# Patient Record
Sex: Female | Born: 1975 | Race: Black or African American | Hispanic: No | Marital: Married | Smoking: Current every day smoker
Health system: Southern US, Community
[De-identification: ages and names within clinical notes are randomized; demographics above are authoritative.]

---

## 1997-05-21 ENCOUNTER — Inpatient Hospital Stay (HOSPITAL_COMMUNITY): Admission: AD | Admit: 1997-05-21 | Discharge: 1997-05-21 | Payer: Self-pay | Admitting: Obstetrics & Gynecology

## 1997-08-13 ENCOUNTER — Inpatient Hospital Stay (HOSPITAL_COMMUNITY): Admission: AD | Admit: 1997-08-13 | Discharge: 1997-08-14 | Payer: Self-pay | Admitting: Obstetrics

## 1998-06-28 ENCOUNTER — Emergency Department (HOSPITAL_COMMUNITY): Admission: EM | Admit: 1998-06-28 | Discharge: 1998-06-28 | Payer: Self-pay | Admitting: Emergency Medicine

## 1998-08-19 ENCOUNTER — Other Ambulatory Visit: Admission: RE | Admit: 1998-08-19 | Discharge: 1998-08-19 | Payer: Self-pay | Admitting: Obstetrics and Gynecology

## 1998-11-02 ENCOUNTER — Inpatient Hospital Stay (HOSPITAL_COMMUNITY): Admission: AD | Admit: 1998-11-02 | Discharge: 1998-11-02 | Payer: Self-pay | Admitting: *Deleted

## 1998-12-15 ENCOUNTER — Inpatient Hospital Stay (HOSPITAL_COMMUNITY): Admission: AD | Admit: 1998-12-15 | Discharge: 1998-12-15 | Payer: Self-pay | Admitting: Obstetrics and Gynecology

## 1999-02-06 ENCOUNTER — Inpatient Hospital Stay (HOSPITAL_COMMUNITY): Admission: AD | Admit: 1999-02-06 | Discharge: 1999-02-08 | Payer: Self-pay | Admitting: *Deleted

## 1999-08-28 ENCOUNTER — Other Ambulatory Visit: Admission: RE | Admit: 1999-08-28 | Discharge: 1999-08-28 | Payer: Self-pay | Admitting: Obstetrics & Gynecology

## 2000-06-14 ENCOUNTER — Emergency Department (HOSPITAL_COMMUNITY): Admission: EM | Admit: 2000-06-14 | Discharge: 2000-06-14 | Payer: Self-pay | Admitting: Emergency Medicine

## 2001-08-21 ENCOUNTER — Emergency Department (HOSPITAL_COMMUNITY): Admission: EM | Admit: 2001-08-21 | Discharge: 2001-08-21 | Payer: Self-pay | Admitting: Emergency Medicine

## 2002-09-25 ENCOUNTER — Emergency Department (HOSPITAL_COMMUNITY): Admission: EM | Admit: 2002-09-25 | Discharge: 2002-09-25 | Payer: Self-pay | Admitting: Emergency Medicine

## 2002-10-15 ENCOUNTER — Emergency Department (HOSPITAL_COMMUNITY): Admission: EM | Admit: 2002-10-15 | Discharge: 2002-10-15 | Payer: Self-pay | Admitting: Emergency Medicine

## 2003-10-21 ENCOUNTER — Emergency Department (HOSPITAL_COMMUNITY): Admission: EM | Admit: 2003-10-21 | Discharge: 2003-10-21 | Payer: Self-pay | Admitting: *Deleted

## 2004-05-11 ENCOUNTER — Emergency Department (HOSPITAL_COMMUNITY): Admission: EM | Admit: 2004-05-11 | Discharge: 2004-05-11 | Payer: Self-pay | Admitting: Emergency Medicine

## 2004-06-02 ENCOUNTER — Emergency Department (HOSPITAL_COMMUNITY): Admission: EM | Admit: 2004-06-02 | Discharge: 2004-06-02 | Payer: Self-pay | Admitting: Emergency Medicine

## 2004-08-04 ENCOUNTER — Emergency Department (HOSPITAL_COMMUNITY): Admission: EM | Admit: 2004-08-04 | Discharge: 2004-08-05 | Payer: Self-pay | Admitting: Emergency Medicine

## 2004-08-18 ENCOUNTER — Inpatient Hospital Stay (HOSPITAL_COMMUNITY): Admission: AD | Admit: 2004-08-18 | Discharge: 2004-08-18 | Payer: Self-pay | Admitting: *Deleted

## 2004-08-25 ENCOUNTER — Inpatient Hospital Stay (HOSPITAL_COMMUNITY): Admission: AD | Admit: 2004-08-25 | Discharge: 2004-08-25 | Payer: Self-pay | Admitting: Obstetrics and Gynecology

## 2004-10-14 ENCOUNTER — Other Ambulatory Visit: Admission: RE | Admit: 2004-10-14 | Discharge: 2004-10-14 | Payer: Self-pay | Admitting: Obstetrics and Gynecology

## 2005-02-28 ENCOUNTER — Observation Stay (HOSPITAL_COMMUNITY): Admission: AD | Admit: 2005-02-28 | Discharge: 2005-02-28 | Payer: Self-pay | Admitting: Obstetrics and Gynecology

## 2005-03-01 ENCOUNTER — Inpatient Hospital Stay (HOSPITAL_COMMUNITY): Admission: AD | Admit: 2005-03-01 | Discharge: 2005-03-02 | Payer: Self-pay | Admitting: Obstetrics and Gynecology

## 2005-03-02 ENCOUNTER — Inpatient Hospital Stay (HOSPITAL_COMMUNITY): Admission: AD | Admit: 2005-03-02 | Discharge: 2005-03-04 | Payer: Self-pay | Admitting: Obstetrics and Gynecology

## 2005-09-18 IMAGING — US US OB COMP LESS 14 WK
1 series · 14 of 17 positions shown · non-contrast
Comparison: none

CLINICAL DATA: Abdominal pain with nausea and vomiting.
EARLY OBSTETRICAL ULTRASOUND:
Multiple images of the uterus and adnexa were obtained using a transabdominal approach.
There is a single intrauterine pregnancy identified that demonstrates an estimated gestational age by ultrasound of 10 weeks and 6 days.  Positive regular fetal cardiac activity with a rate of 175 bpm was noted.  A normal appearing yolk sac and amnion are seen.  An small old subchorionic hemorrhage is noted.
Both ovaries are seen with the right ovary measuring 2.1 x 3.0 x 2.1 cm and containing a corpus luteum cyst.  The left ovary measures 3.2 x 1.1 x 1.3 cm and has a normal ultrasound appearance.  No cul-de-sac or periovarian fluid is seen and no separate adnexal masses are noted.

[Series 1: us ob comp less 14 wk · 0.29mm/px · 14 of 17 slices shown]
[im 1/17]
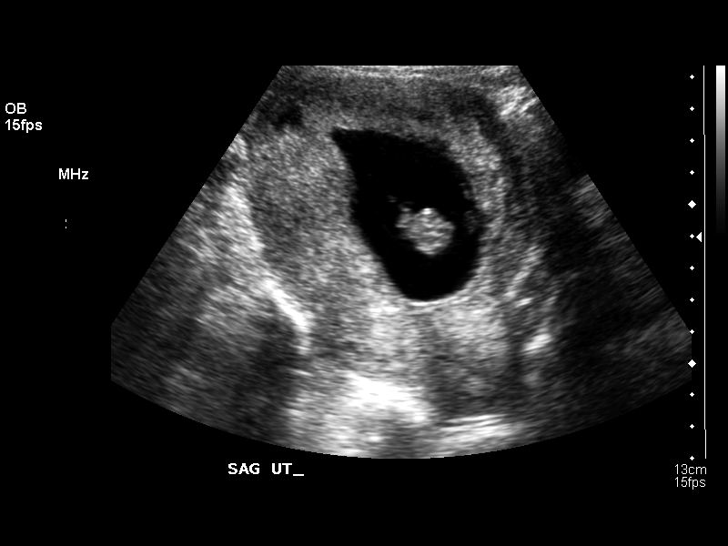
[im 2/17]
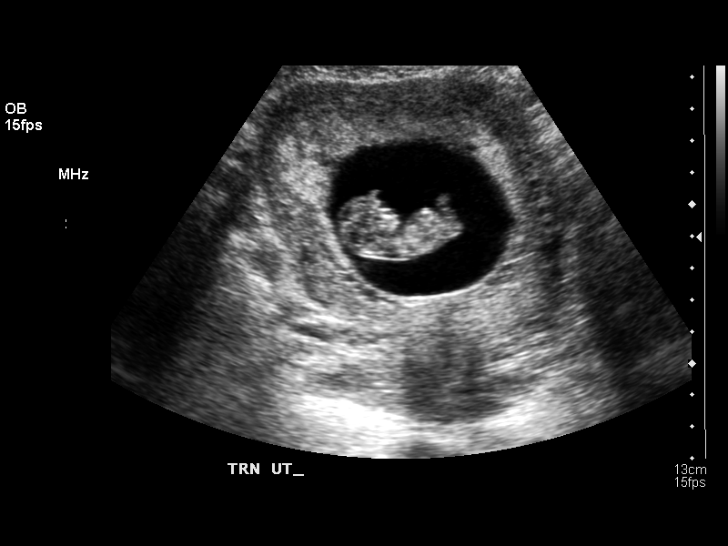
[im 4/17]
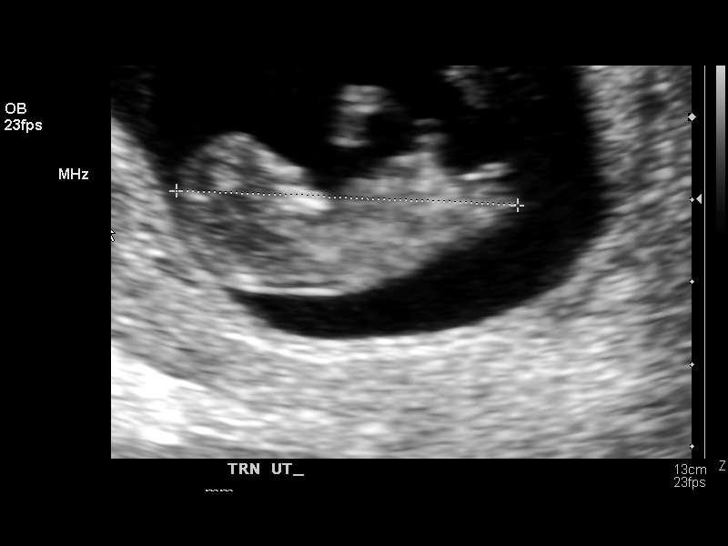
[im 5/17]
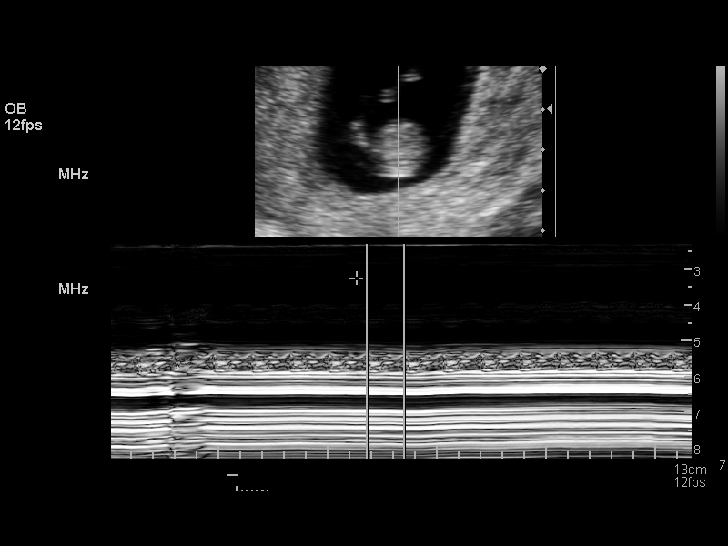
[im 6/17]
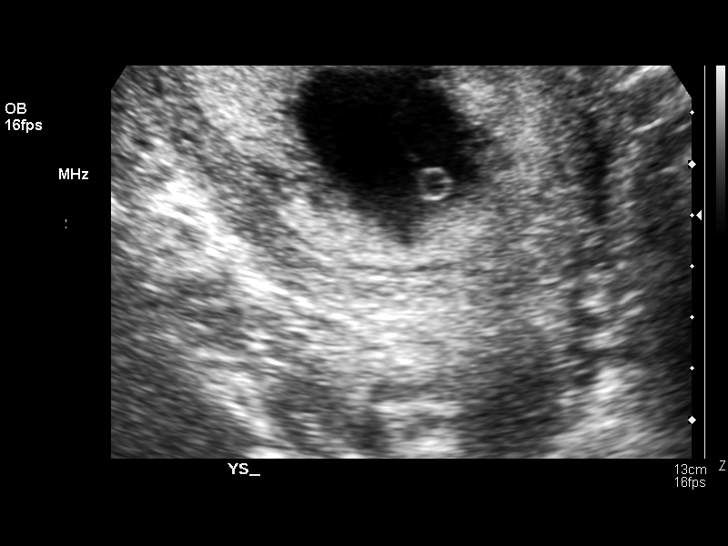
[im 7/17]
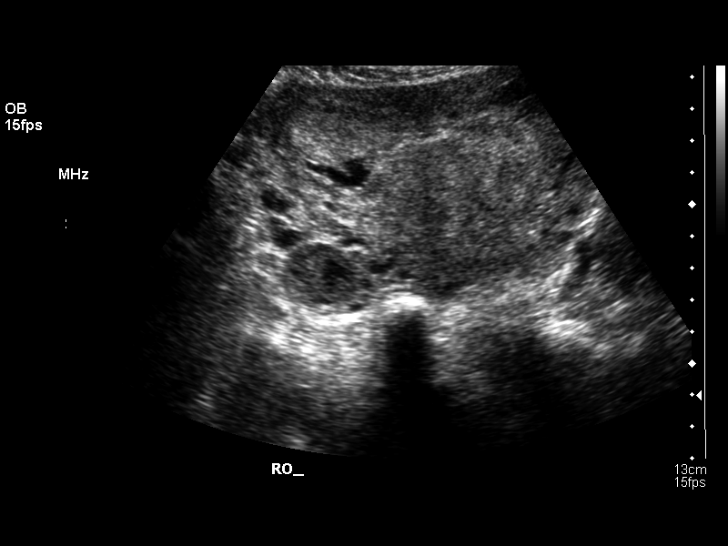
[im 8/17]
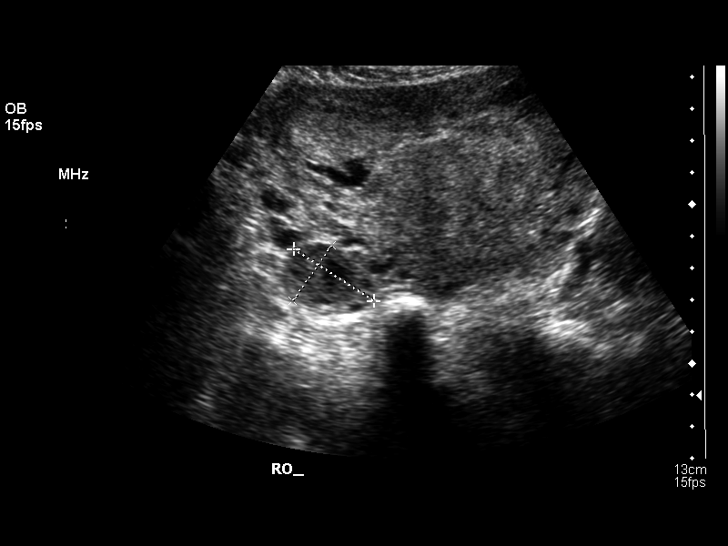
[im 10/17]
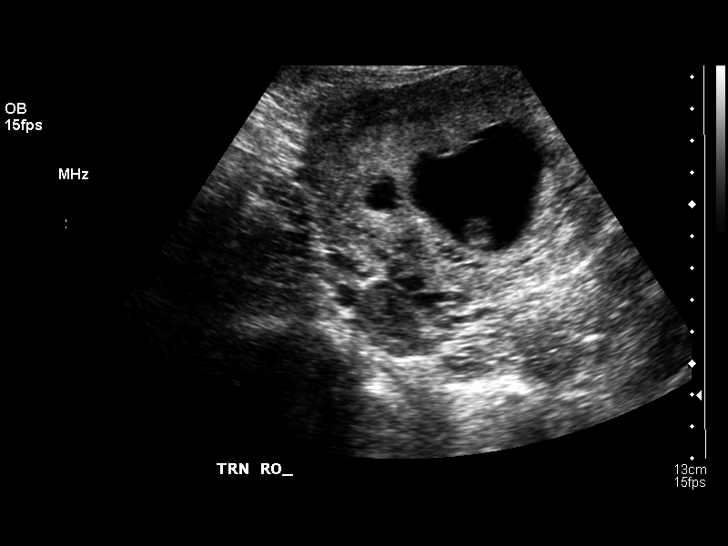
[im 11/17]
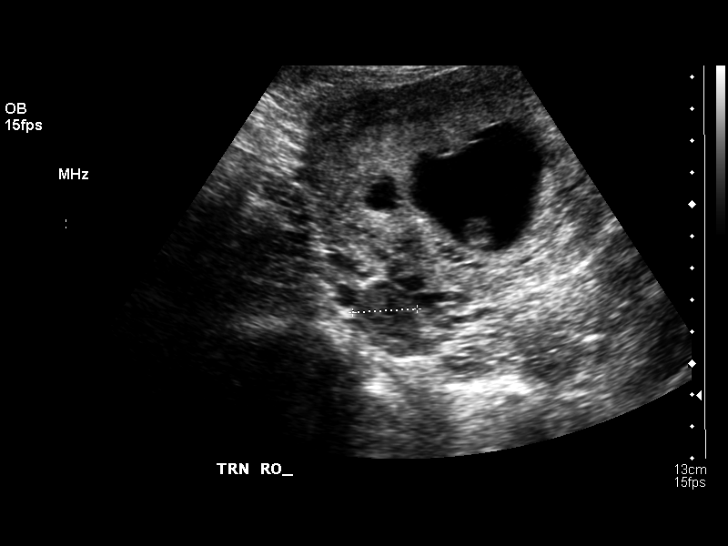
[im 12/17]
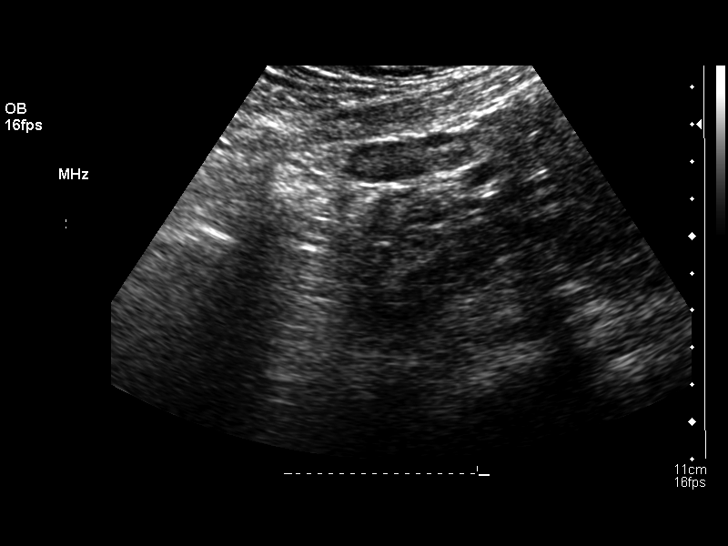
[im 13/17]
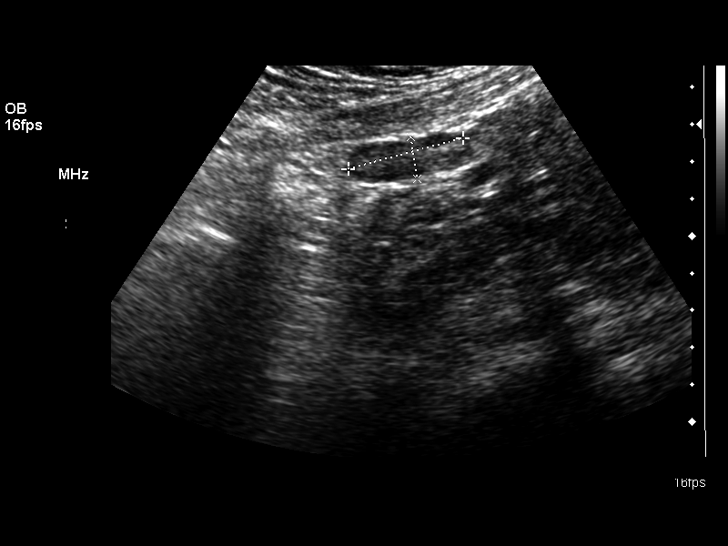
[im 14/17]
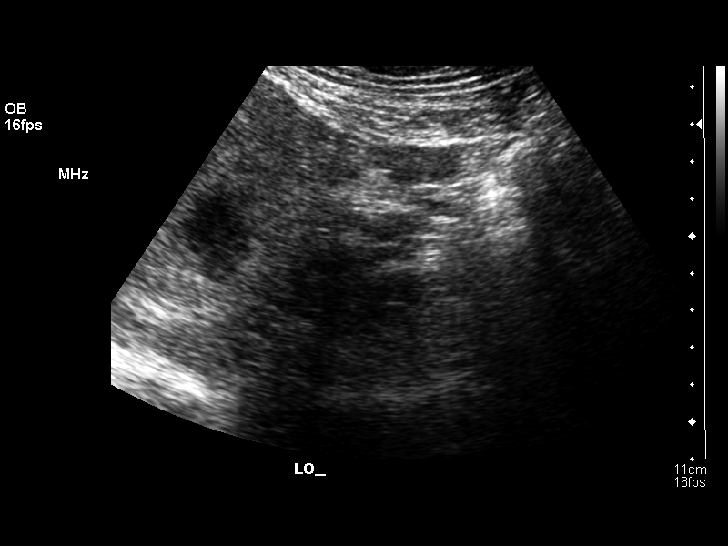
[im 16/17]
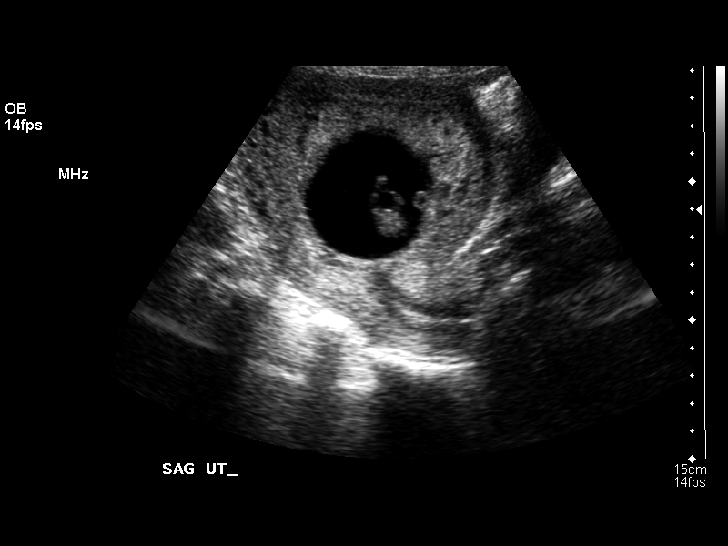
[im 17/17]
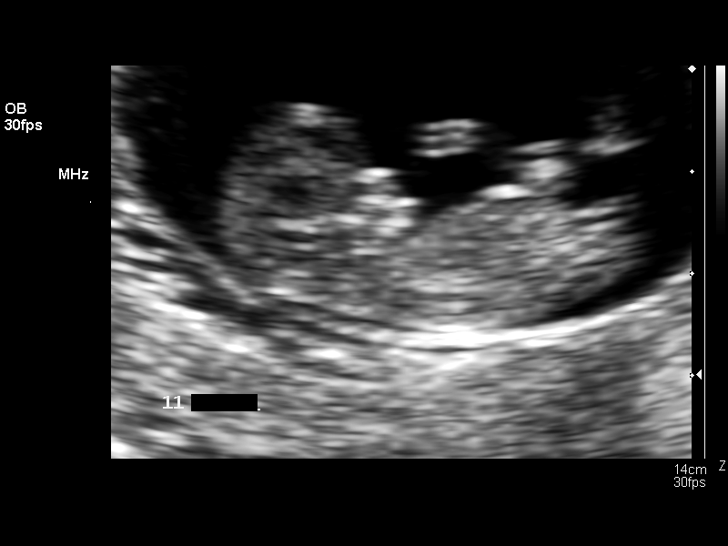

[14 of 17 positions shown; findings below may reference images not displayed]

IMPRESSION: 10 week 6 day living intrauterine pregnancy.  Small old subchorionic hemorrhage.  Normal ovaries.

## 2010-03-12 ENCOUNTER — Emergency Department (HOSPITAL_COMMUNITY)
Admission: EM | Admit: 2010-03-12 | Discharge: 2010-03-12 | Payer: Self-pay | Source: Home / Self Care | Admitting: Emergency Medicine

## 2010-06-16 LAB — CBC
Hemoglobin: 13.1 g/dL (ref 12.0–15.0)
MCH: 29 pg (ref 26.0–34.0)
MCV: 87.4 fL (ref 78.0–100.0)
Platelets: 251 10*3/uL (ref 150–400)
RDW: 13.5 % (ref 11.5–15.5)
WBC: 9.6 10*3/uL (ref 4.0–10.5)

## 2010-06-16 LAB — BASIC METABOLIC PANEL
CO2: 21 mEq/L (ref 19–32)
Chloride: 110 mEq/L (ref 96–112)
GFR calc Af Amer: >60 mL/min (ref >60)
GFR calc non Af Amer: >60 mL/min (ref >60)
Glucose, Bld: 83 mg/dL (ref 70–99)
Potassium: 3.6 mEq/L (ref 3.5–5.1)

## 2010-06-16 LAB — DIFFERENTIAL
Basophils Absolute: 0 10*3/uL (ref 0.0–0.1)
Monocytes Relative: 8 % (ref 3–12)
Neutrophils Relative %: 64 % (ref 43–77)

## 2013-06-18 ENCOUNTER — Emergency Department (HOSPITAL_COMMUNITY): Payer: Self-pay

## 2013-06-18 ENCOUNTER — Encounter (HOSPITAL_COMMUNITY): Payer: Self-pay | Admitting: Emergency Medicine

## 2013-06-18 ENCOUNTER — Emergency Department (HOSPITAL_COMMUNITY)
Admission: EM | Admit: 2013-06-18 | Discharge: 2013-06-18 | Disposition: A | Discharge status: Home / Self Care | Payer: Self-pay | Attending: Emergency Medicine | Admitting: Emergency Medicine

## 2013-06-18 DIAGNOSIS — Y92009 Unspecified place in unspecified non-institutional (private) residence as the place of occurrence of the external cause: Secondary | ICD-10-CM | POA: Insufficient documentation

## 2013-06-18 DIAGNOSIS — S92912A Unspecified fracture of left toe(s), initial encounter for closed fracture: Secondary | ICD-10-CM

## 2013-06-18 DIAGNOSIS — Z79899 Other long term (current) drug therapy: Secondary | ICD-10-CM | POA: Insufficient documentation

## 2013-06-18 DIAGNOSIS — F17200 Nicotine dependence, unspecified, uncomplicated: Secondary | ICD-10-CM | POA: Insufficient documentation

## 2013-06-18 DIAGNOSIS — W2209XA Striking against other stationary object, initial encounter: Secondary | ICD-10-CM | POA: Insufficient documentation

## 2013-06-18 DIAGNOSIS — S92919A Unspecified fracture of unspecified toe(s), initial encounter for closed fracture: Secondary | ICD-10-CM | POA: Insufficient documentation

## 2013-06-18 DIAGNOSIS — Y9389 Activity, other specified: Secondary | ICD-10-CM | POA: Insufficient documentation

## 2013-06-18 MED ORDER — OXYCODONE-ACETAMINOPHEN 5-325 MG PO TABS: 1.0000 | ORAL_TABLET | Freq: Four times a day (QID) | ORAL | Status: AC | PRN

## 2013-06-18 NOTE — ED Notes (Signed)
Pt states she hit middle toe on left foot 1 week ago and now states it is hard to walk on.

## 2013-06-18 NOTE — Discharge Instructions (Signed)
Toe Fracture  Your caregiver has diagnosed you as having a fractured toe. A toe fracture is a break in the bone of a toe. "Buddy taping" is a way of splinting your broken toe, by taping the broken toe to the toe next to it. This "buddy taping" will keep the injured toe from moving beyond normal range of motion. Buddy taping also helps the toe heal in a more normal alignment. It may take 6 to 8 weeks for the toe injury to heal.  HOME CARE INSTRUCTIONS    Leave your toes taped together for as long as directed by your caregiver or until you see a doctor for a follow-up examination. You can change the tape after bathing. Always use a small piece of gauze or cotton between the toes when taping them together. This will help the skin stay dry and prevent infection.   Apply ice to the injury for 15-20 minutes each hour while awake for the first 2 days. Put the ice in a plastic bag and place a towel between the bag of ice and your skin.   After the first 2 days, apply heat to the injured area. Use heat for the next 2 to 3 days. Place a heating pad on the foot or soak the foot in warm water as directed by your caregiver.   Keep your foot elevated as much as possible to lessen swelling.   Wear sturdy, supportive shoes. The shoes should not pinch the toes or fit tightly against the toes.   Your caregiver may prescribe a rigid shoe if your foot is very swollen.   Your may be given crutches if the pain is too great and it hurts too much to walk.   Only take over-the-counter or prescription medicines for pain, discomfort, or fever as directed by your caregiver.   If your caregiver has given you a follow-up appointment, it is very important to keep that appointment. Not keeping the appointment could result in a chronic or permanent injury, pain, and disability. If there is any problem keeping the appointment, you must call back to this facility for assistance.  SEEK MEDICAL CARE IF:    You have increased pain or swelling,  not relieved with medications.   The pain does not get better after 1 week.   Your injured toe is cold when the others are warm.  SEEK IMMEDIATE MEDICAL CARE IF:    The toe becomes cold, numb, or white.   The toe becomes hot (inflamed) and red.  Document Released: 03/20/2000 Document Revised: 06/15/2011 Document Reviewed: 11/07/2007  [employer] Patient Information 2014 [employer]

## 2013-06-18 NOTE — ED Provider Notes (Signed)
CSN: [id number]     Arrival date & time 06/18/13  0716 History   First MD Initiated Contact with Patient 06/18/13 [phone]     Chief Complaint  Patient presents with  . Toe Pain    left, middle toe     (Consider location/radiation/quality/duration/timing/severity/associated sxs/prior Treatment) Patient is a 38 y.o. female presenting with toe pain. The history is provided by the patient.  Toe Pain This is a new problem. The current episode started more than 2 days ago.   patient states that she hit her left middle toe on a table around a week ago. She's had pain since. No swelling. Is worse with walking. No other injury.  History reviewed. No pertinent past medical history. History reviewed. No pertinent past surgical history. No family history on file. History  Substance Use Topics  . Smoking status: Current Every Day Smoker  . Smokeless tobacco: Not on file  . Alcohol Use: No   OB History   Grav Para Term Preterm Abortions TAB SAB Ect Mult Living                 Review of Systems  Musculoskeletal: Positive for gait problem.  Skin: Negative for wound.  Neurological: Negative for weakness and numbness.      Allergies  Review of patient's allergies indicates no known allergies.  Home Medications   Current Outpatient Rx  Name  Route  Sig  Dispense  Refill  . ibuprofen (ADVIL,MOTRIN) 200 MG tablet   Oral   Take 800 mg by mouth every 4 (four) hours as needed for moderate pain.         . medroxyPROGESTERone (DEPO-PROVERA) 150 MG/ML injection   Intramuscular   Inject 150 mg into the muscle every 3 (three) months.         Marland Kitchen. oxyCODONE-acetaminophen (PERCOCET/ROXICET) 5-325 MG per tablet   Oral   Take 1-2 tablets by mouth every 6 (six) hours as needed for severe pain.   15 tablet   0    BP 141/94  Pulse 72  Temp(Src) 98 F (36.7 C) (Oral)  Resp 20  SpO2 100% Physical Exam  Constitutional: She appears well-developed and well-nourished.  Musculoskeletal: She  exhibits tenderness.  Tenderness and swelling to left third distal metatarsal and phalanx. Skin is intact. Pain with movement.    ED Course  Procedures (including critical care time) Labs Review Labs Reviewed - No data to display Imaging Review Dg Foot Complete Left  06/18/2013   CLINICAL DATA:  Blunt trauma to the foot with pain at the third digit  EXAM: LEFT FOOT - COMPLETE 3+ VIEW  COMPARISON:  None.  FINDINGS: There is an oblique fracture through the proximal phalanx of the left third toe. No radiopaque foreign body. No soft tissue abnormality.  IMPRESSION: Oblique fracture, proximal phalanx of the left third toe.   Electronically Signed   By: Christiana PellantGretchen  Green M.D.   On: 06/18/2013 08:24     EKG Interpretation None      MDM   Final diagnoses:  Fracture of toe of left foot    Patient with toe fracture about a week ago. Oblique fracture on x-ray. Will buddy tape and give postop shoe. orhto followup as needed    Juliet Rudeathan R. Rubin PayorPickering, MD 06/18/13 [phone]

## 2014-07-19 IMAGING — CR DG FOOT COMPLETE 3+V*L*
3 series · 3 of 3 positions shown · non-contrast
Comparison: None.

CLINICAL DATA: Blunt trauma to the foot with pain at the third
digit

EXAM:
LEFT FOOT - COMPLETE 3+ VIEW

[x foot ap left]
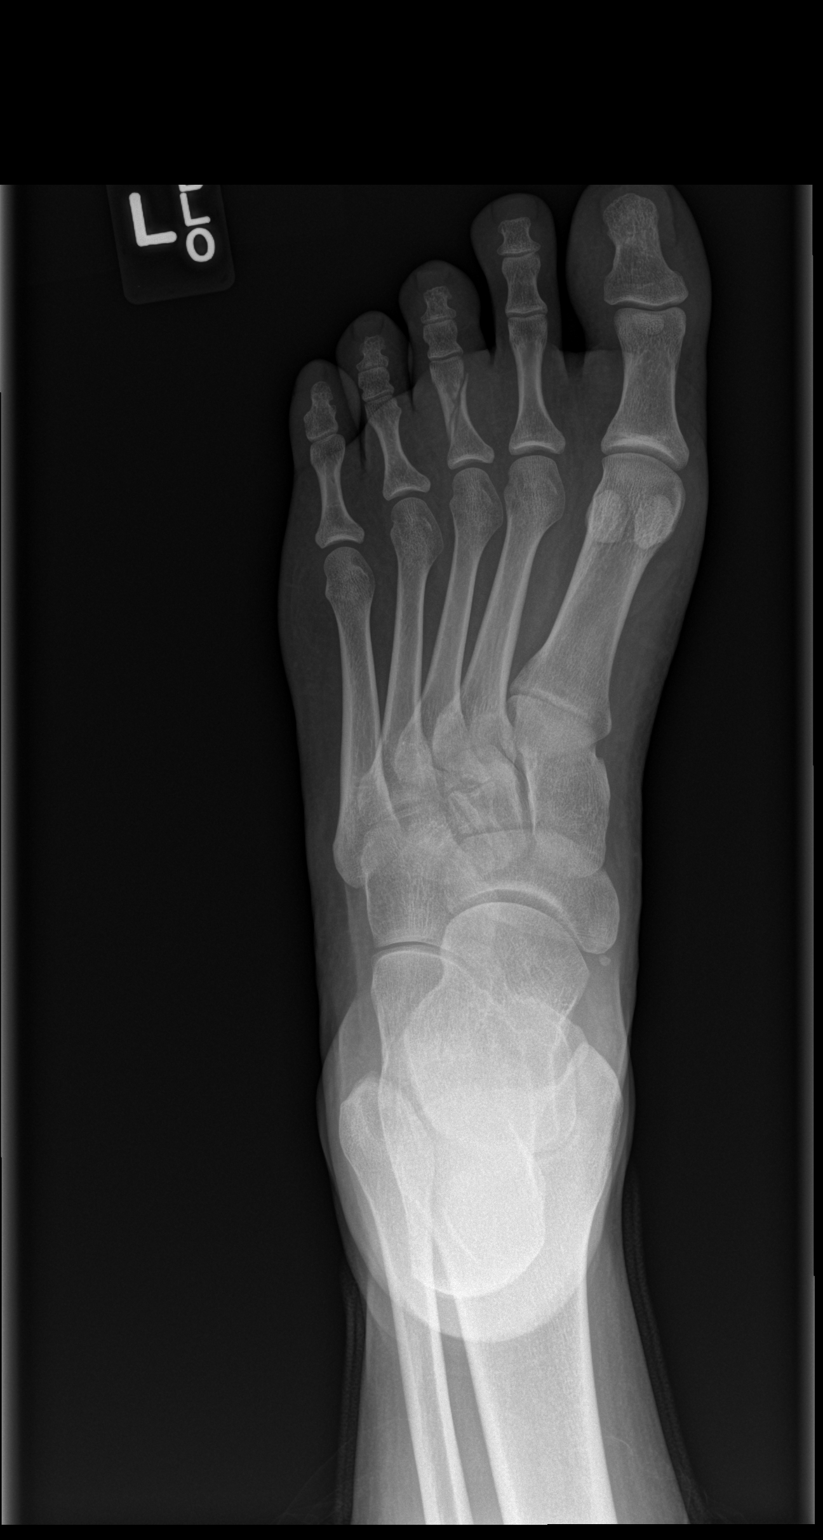

[x foot obl left]
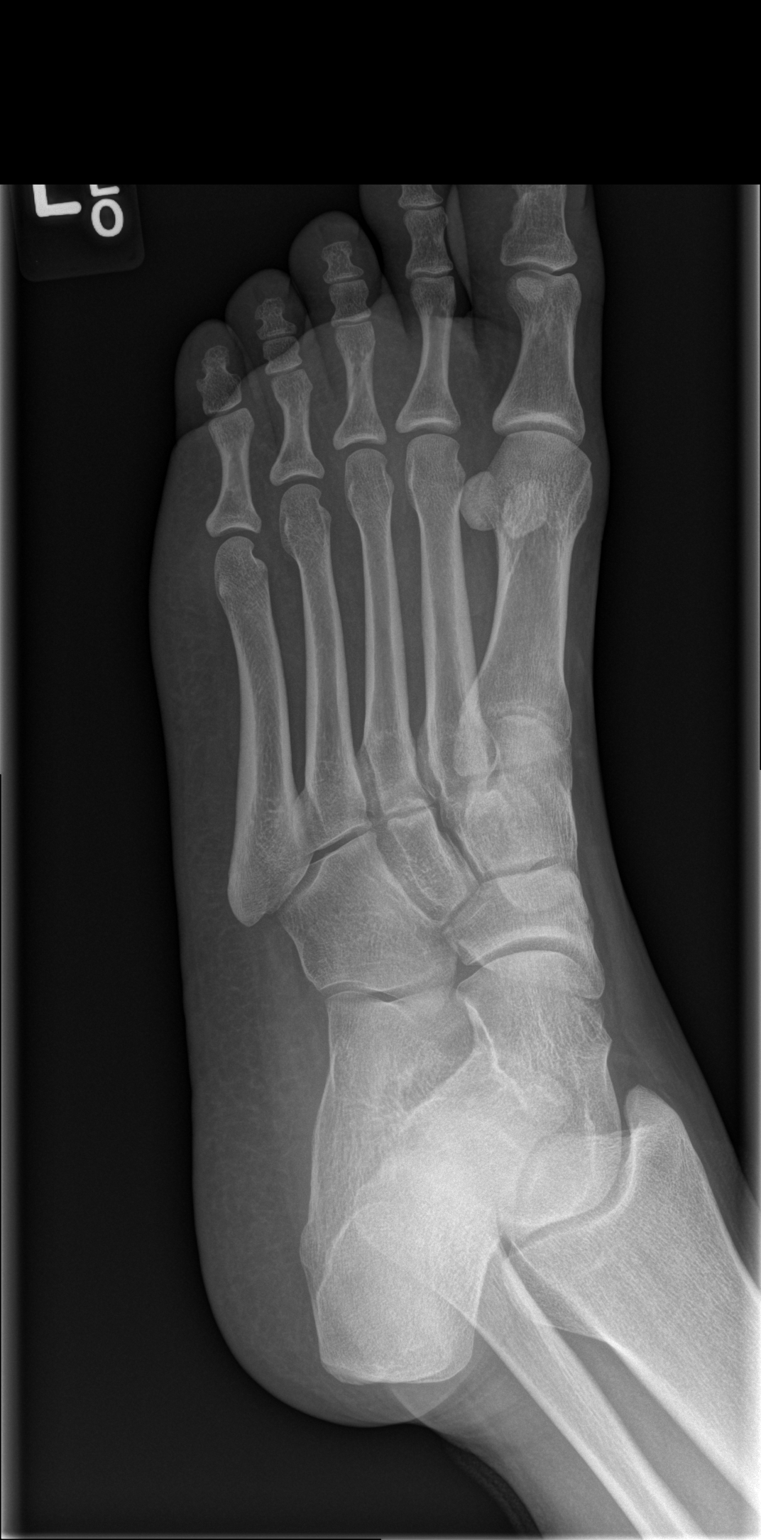

[x foot lat left]
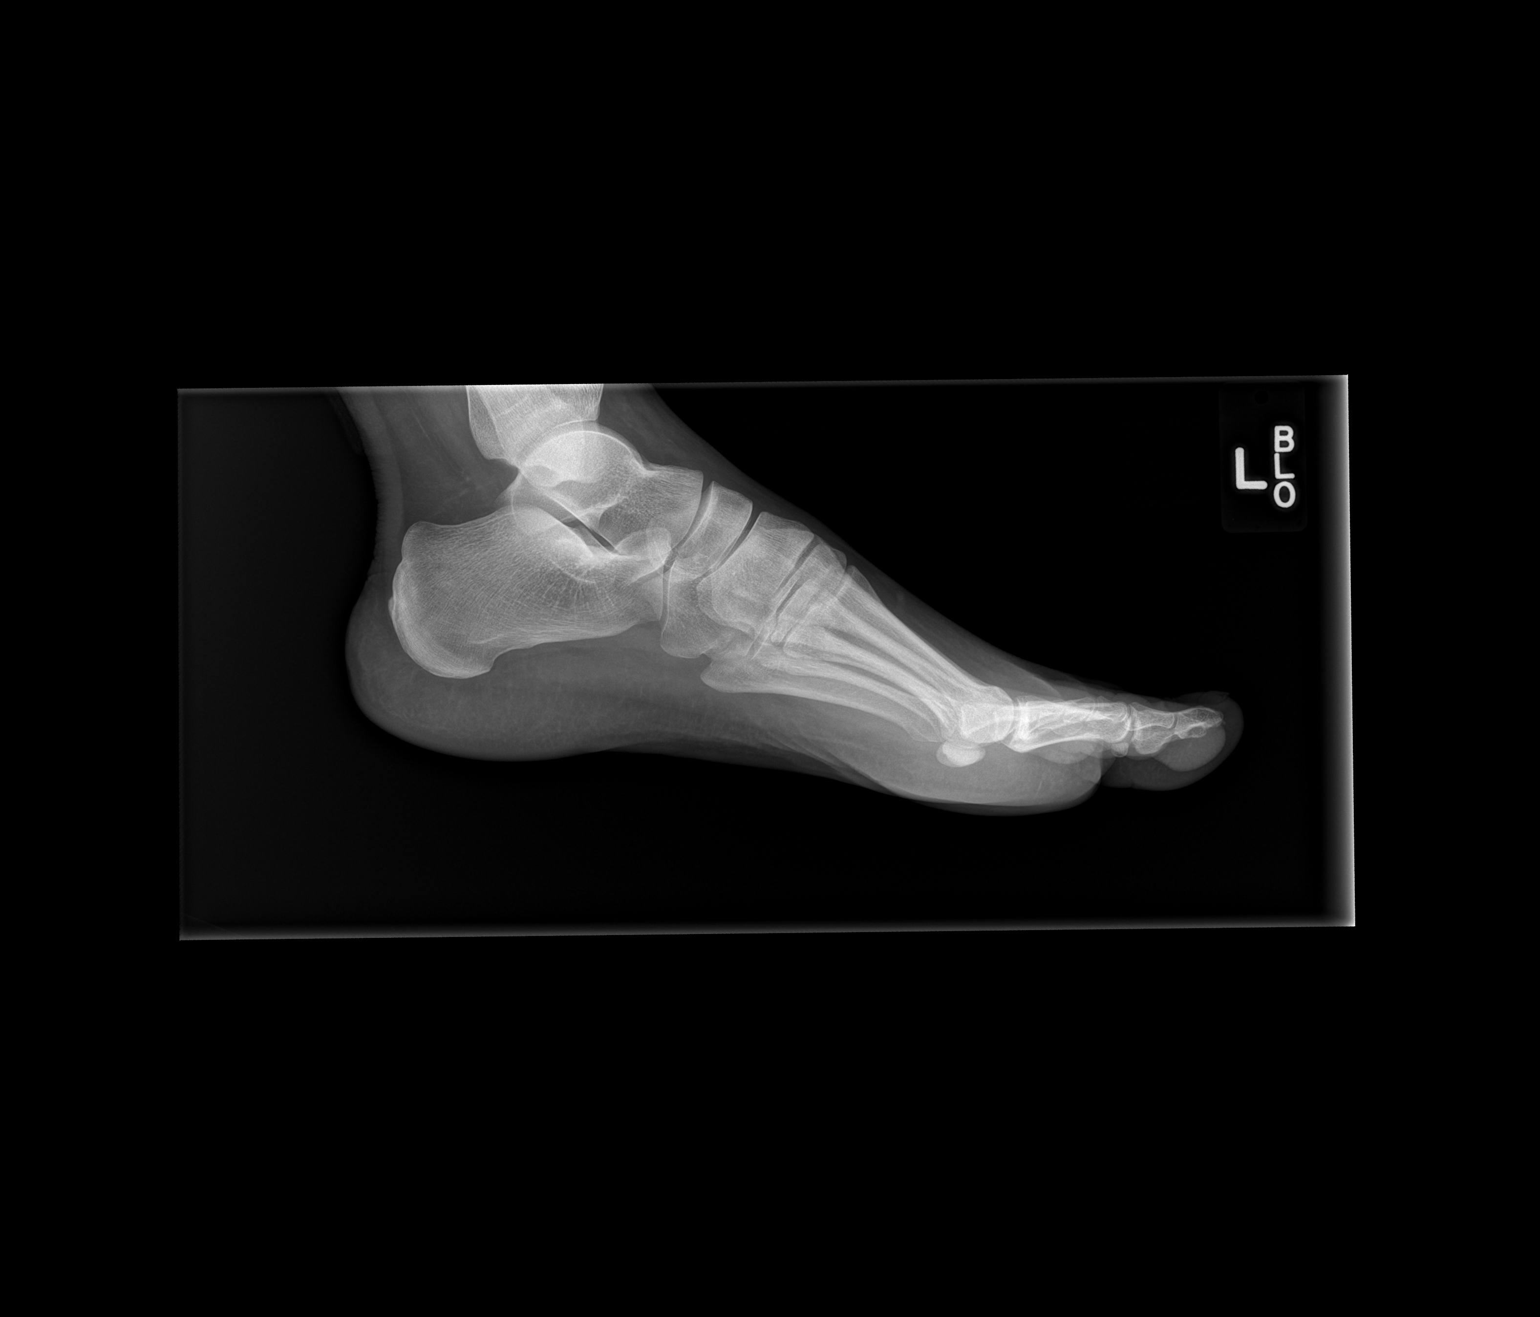

[3 of 3 positions shown; findings below may reference images not displayed]

FINDINGS: There is an oblique fracture through the proximal phalanx of the
left third toe. No radiopaque foreign body. No soft tissue
abnormality.
IMPRESSION: Oblique fracture, proximal phalanx of the left third toe.

## 2016-09-17 ENCOUNTER — Emergency Department (HOSPITAL_COMMUNITY)
Admission: EM | Admit: 2016-09-17 | Discharge: 2016-09-17 | Disposition: A | Discharge status: Home / Self Care | Payer: Self-pay | Attending: Emergency Medicine | Admitting: Emergency Medicine

## 2016-09-17 ENCOUNTER — Encounter (HOSPITAL_COMMUNITY): Payer: Self-pay | Admitting: Emergency Medicine

## 2016-09-17 DIAGNOSIS — F17200 Nicotine dependence, unspecified, uncomplicated: Secondary | ICD-10-CM | POA: Insufficient documentation

## 2016-09-17 DIAGNOSIS — Z2089 Contact with and (suspected) exposure to other communicable diseases: Secondary | ICD-10-CM | POA: Insufficient documentation

## 2016-09-17 DIAGNOSIS — Z207 Contact with and (suspected) exposure to pediculosis, acariasis and other infestations: Secondary | ICD-10-CM

## 2016-09-17 DIAGNOSIS — R21 Rash and other nonspecific skin eruption: Secondary | ICD-10-CM | POA: Insufficient documentation

## 2016-09-17 DIAGNOSIS — L299 Pruritus, unspecified: Secondary | ICD-10-CM | POA: Insufficient documentation

## 2016-09-17 MED ORDER — PERMETHRIN 5 % EX CREA: TOPICAL_CREAM | CUTANEOUS | 1 refills | Status: AC

## 2016-09-17 MED ORDER — HYDROXYZINE HCL 10 MG PO TABS: 10.0000 mg | ORAL_TABLET | Freq: Four times a day (QID) | ORAL | 0 refills | Status: AC | PRN

## 2016-09-17 NOTE — ED Triage Notes (Signed)
Pt presents with hives to back of arms and legs. Pt states her grandchild recently treated for scabies, pt states she used cream but now areas area spreading and pruritic

## 2016-09-17 NOTE — ED Provider Notes (Signed)
[facility]-EMERGENCY DEPT Provider Note   CSN: [id number] Arrival date & time: 09/17/16  0038     History   Chief Complaint Chief Complaint  Patient presents with  . Allergic Reaction    HPI Abigail Curtis is a 41 y.o. female.  Patient presents to the ED with a chief complaint of itching and rash.  She states that she was exposed to scabies and also got some new used furniture.  She denies any fevers, chills, nausea, vomiting, SOB.  She has tried taking benadryl with some relief.  There are no other associated symptoms.   The history is provided by the patient. No language interpreter was used.    History reviewed. No pertinent past medical history.  There are no active problems to display for this patient.   History reviewed. No pertinent surgical history.  OB History    No data available       Home Medications    Prior to Admission medications   Medication Sig Start Date End Date Taking? Authorizing Provider  hydrOXYzine (ATARAX/VISTARIL) 10 MG tablet Take 1 tablet (10 mg total) by mouth every 6 (six) hours as needed for itching. 09/17/16   Roxy Horseman, PA-C  ibuprofen (ADVIL,MOTRIN) 200 MG tablet Take 800 mg by mouth every 4 (four) hours as needed for moderate pain.    [provider]  medroxyPROGESTERone (DEPO-PROVERA) 150 MG/ML injection Inject 150 mg into the muscle every 3 (three) months.    [provider]  oxyCODONE-acetaminophen (PERCOCET/ROXICET) 5-325 MG per tablet Take 1-2 tablets by mouth every 6 (six) hours as needed for severe pain. 06/18/13   Benjiman Core, MD  permethrin (ELIMITE) 5 % cream Apply to entire body other than face - let sit for 14 hours then wash off, may repeat in 1 week if still having symptoms 09/17/16   Roxy Horseman, PA-C    Family History No family history on file.  Social History Social History  Substance Use Topics  . Smoking status: Current Every Day Smoker  . Smokeless tobacco: Never Used  .  Alcohol use No     Allergies   Patient has no known allergies.   Review of Systems Review of Systems  All other systems reviewed and are negative.    Physical Exam Updated Vital Signs BP 128/87 (BP Location: Right Arm)   Pulse 92   Temp 98.7 F (37.1 C) (Oral)   Resp 18   Ht 5\' 6"  (1.676 m)   Wt 80.7 kg (178 lb)   SpO2 97%   BMI 28.73 kg/m   Physical Exam  Constitutional: She is oriented to person, place, and time. She appears well-developed and well-nourished.  HENT:  Head: Normocephalic and atraumatic.  Eyes: Conjunctivae and EOM are normal.  Neck: Normal range of motion.  Cardiovascular: Normal rate.   Pulmonary/Chest: Effort normal.  Abdominal: She exhibits no distension.  Musculoskeletal: Normal range of motion.  Neurological: She is alert and oriented to person, place, and time.  Skin: Skin is dry. Rash noted.  Mild urticaria type rash on extremities   Psychiatric: She has a normal mood and affect. Her behavior is normal. Judgment and thought content normal.  Nursing note and vitals reviewed.    ED Treatments / Results  Labs (all labs ordered are listed, but only abnormal results are displayed) Labs Reviewed - No data to display  EKG  EKG Interpretation None       Radiology No results found.  Procedures Procedures (including critical care time)  Medications Ordered in ED Medications - No data to display   Initial Impression / Assessment and Plan / ED Course  I have reviewed the triage vital signs and the nursing notes.  Pertinent labs & imaging results that were available during my care of the patient were reviewed by me and considered in my medical decision making (see chart for details).     Patient with recent scabies exposure.  Complains of bites and rash on extremities.  No evidence of anaphylaxis.  Will treat with permethrin and atarax.  PCP follow-up if no improvement.  Final Clinical Impressions(s) / ED Diagnoses   Final  diagnoses:  Scabies exposure    New Prescriptions New Prescriptions   HYDROXYZINE (ATARAX/VISTARIL) 10 MG TABLET    Take 1 tablet (10 mg total) by mouth every 6 (six) hours as needed for itching.   PERMETHRIN (ELIMITE) 5 % CREAM    Apply to entire body other than face - let sit for 14 hours then wash off, may repeat in 1 week if still having symptoms     Roxy HorsemanBrowning, Marsha Gundlach, PA-C 09/17/16 0120    Alvira MondaySchlossman, Erin, MD 09/17/16 [phone]
